# Patient Record
Sex: Female | Born: 1990
Health system: Southern US, Community
[De-identification: ages and names within clinical notes are randomized; demographics above are authoritative.]

## PROBLEM LIST (undated history)

## (undated) HISTORY — PX: TONSILLECTOMY AND ADENOIDECTOMY: SHX28

## (undated) HISTORY — PX: FINGER SURGERY: SHX640

---
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
## 2014-08-26 ENCOUNTER — Other Ambulatory Visit: Payer: Self-pay | Admitting: Obstetrics and Gynecology

## 2014-08-27 LAB — CYTOLOGY - PAP

## 2016-09-23 ENCOUNTER — Emergency Department (HOSPITAL_COMMUNITY): Payer: No Typology Code available for payment source

## 2016-09-23 ENCOUNTER — Emergency Department (HOSPITAL_COMMUNITY)
Admission: EM | Admit: 2016-09-23 | Discharge: 2016-09-23 | Disposition: A | Discharge status: Home / Self Care | Payer: No Typology Code available for payment source | Attending: Emergency Medicine | Admitting: Emergency Medicine

## 2016-09-23 ENCOUNTER — Encounter (HOSPITAL_COMMUNITY): Payer: Self-pay | Admitting: Emergency Medicine

## 2016-09-23 DIAGNOSIS — J029 Acute pharyngitis, unspecified: Secondary | ICD-10-CM

## 2016-09-23 DIAGNOSIS — Z79899 Other long term (current) drug therapy: Secondary | ICD-10-CM | POA: Diagnosis not present

## 2016-09-23 LAB — CBC WITH DIFFERENTIAL/PLATELET
Basophils Absolute: 0 10*3/uL (ref 0.0–0.1)
Basophils Relative: 0 %
Eosinophils Absolute: 0.1 10*3/uL (ref 0.0–0.7)
Eosinophils Relative: 0 %
HEMATOCRIT: 42 % (ref 36.0–46.0)
HEMOGLOBIN: 14.2 g/dL (ref 12.0–15.0)
LYMPHS ABS: 1.8 10*3/uL (ref 0.7–4.0)
LYMPHS PCT: 15 %
MCH: 30.8 pg (ref 26.0–34.0)
MCHC: 33.8 g/dL (ref 30.0–36.0)
MCV: 91.1 fL (ref 78.0–100.0)
MONOS PCT: 7 %
Monocytes Absolute: 0.8 10*3/uL (ref 0.1–1.0)
NEUTROS PCT: 78 %
Neutro Abs: 9.2 10*3/uL — ABNORMAL HIGH (ref 1.7–7.7)
Platelets: 247 10*3/uL (ref 150–400)
RBC: 4.61 MIL/uL (ref 3.87–5.11)
RDW: 12.5 % (ref 11.5–15.5)
WBC: 11.9 10*3/uL — AB (ref 4.0–10.5)

## 2016-09-23 LAB — TSH: TSH: 4.224 u[IU]/mL (ref 0.350–4.500)

## 2016-09-23 LAB — RAPID STREP SCREEN (MED CTR MEBANE ONLY): Streptococcus, Group A Screen (Direct): NEGATIVE

## 2016-09-23 LAB — I-STAT BETA HCG BLOOD, ED (MC, WL, AP ONLY): I-stat hCG, quantitative: <5 m[IU]/mL (ref <5)

## 2016-09-23 MED ORDER — PREDNISONE 20 MG PO TABS: 40.0000 mg | ORAL_TABLET | Freq: Every day | ORAL | 0 refills | Status: AC

## 2016-09-23 MED ORDER — DEXAMETHASONE SODIUM PHOSPHATE 10 MG/ML IJ SOLN
10.0000 mg | Freq: Once | INTRAMUSCULAR | Status: DC
  Filled 2016-09-23: qty 1

## 2016-09-23 MED ORDER — DEXAMETHASONE SODIUM PHOSPHATE 10 MG/ML IJ SOLN
10.0000 mg | Freq: Once | INTRAMUSCULAR | Status: AC
  Administered 2016-09-23: 10 mg via INTRAVENOUS

## 2016-09-23 MED ORDER — IOPAMIDOL (ISOVUE-300) INJECTION 61%
INTRAVENOUS | Status: AC
  Administered 2016-09-23: 75 mL
  Filled 2016-09-23: qty 75

## 2016-09-23 MED FILL — predniSONE 20 MG TABS: 20 | 5 days supply | Qty: 10 | Fill #0

## 2016-09-23 NOTE — Discharge Instructions (Signed)
Tylenol or Motrin for pain. Salt water gargles several times a day. Prednisone as prescribed until all gone. Over-the-counter allergy medications such as Zyrtec or Claritin daily. Follow-up with family doctor if not improving.

## 2016-09-23 NOTE — ED Notes (Signed)
EDPA Provider at bedside. 

## 2016-09-23 NOTE — ED Notes (Signed)
Will get vitals when pt. Return from xray. Nurse aware.

## 2016-09-23 NOTE — ED Provider Notes (Signed)
[facility]-EMERGENCY DEPT Provider Note   CSN: [id number] Arrival date & time: 09/23/16  [id number]     History   Chief Complaint Chief Complaint  Patient presents with  . Sore Throat  . Shortness of Breath    HPI Emily Quinn is a 26 y.o. female.  HPI Emily Quinn is a 26 y.o. female presents to emergency department complaining of sore throat. Patient states she initially developed some throat discomfort 2 weeks ago. She thought this could be allergies. In the last 2 days her pain has gotten worse. She woke up this morning with significant shortness of breath and feeling like her throat was closing up. She states it is difficult to breathing when laying down flat and she feels like her airway is closing. She is also reporting change in her voice, reports hoarseness, difficulty with speaking. She states that she is able to swallow but is painful. She is also having pain with opening her mouth. She has history of tonsillectomy. She has pain to the anterior neck with palpation. Denies pain with neck movement. Denies any fever or chills. No cough or congestion. She has been taking over-the-counter cold and sinus medication with no relief.  History reviewed. No pertinent past medical history.  There are no active problems to display for this patient.   Past Surgical History:  Procedure Laterality Date  . FINGER SURGERY    . TONSILLECTOMY AND ADENOIDECTOMY      OB History    No data available       Home Medications    Prior to Admission medications   Medication Sig Start Date End Date Taking? Authorizing Provider  Norethindrone-Ethinyl Estradiol-Fe Biphas (LO LOESTRIN FE) 1 MG-10 MCG / 10 MCG tablet Take 1 tablet by mouth daily.   Yes [provider]  Pseudoephed-APAP-Guaifenesin (TYLENOL SINUS SEVERE CONGEST) 30-325-200 MG TABS Take 2 tablets by mouth 2 (two) times daily as needed (for cold).   Yes [provider]    Family History Family History  Problem  Relation Age of Onset  . Hypertension Other   . Heart attack Other     Social History Social History  Substance Use Topics  . Smoking status: Never Smoker  . Smokeless tobacco: Never Used  . Alcohol use Yes     Comment: social     Allergies   Lobster [shellfish allergy]   Review of Systems Review of Systems  Constitutional: Negative for chills and fever.  HENT: Positive for postnasal drip, sore throat, trouble swallowing and voice change. Negative for congestion.   Respiratory: Negative for cough, chest tightness and shortness of breath.   Cardiovascular: Negative for chest pain, palpitations and leg swelling.  Gastrointestinal: Negative for abdominal pain, diarrhea, nausea and vomiting.  Genitourinary: Negative for dysuria, flank pain and pelvic pain.  Musculoskeletal: Negative for arthralgias, myalgias, neck pain and neck stiffness.  Skin: Negative for rash.  Neurological: Negative for dizziness, weakness and headaches.  All other systems reviewed and are negative.    Physical Exam Updated Vital Signs BP 130/89 (BP Location: Right Arm)   Pulse 93   Temp 98.3 F (36.8 C) (Oral)   Resp 18   Ht 5\' 3"  (1.6 m)   Wt 65.8 kg (145 lb)   LMP 08/28/2016 (Exact Date)   SpO2 99%   BMI 25.69 kg/m   Physical Exam  Constitutional: She appears well-developed and well-nourished. No distress.  HENT:  Head: Normocephalic.  Oropharynx is mildly erythematous, tonsils absent. Uvula midline. No  trismus. Patient is talking with her mouth closed with slightly muffled and hoarse voice. No lymphadenopathy.  Eyes: Conjunctivae are normal.  Neck: Neck supple.  Tenderness to palpation over anterior neck, just below the cricoid cartilage. Unable to palpate thyroid, no nodules or swelling noted.  Cardiovascular: Normal rate, regular rhythm and normal heart sounds.   Pulmonary/Chest: Effort normal and breath sounds normal. No respiratory distress. She has no wheezes. She has no rales.    Abdominal: Soft. Bowel sounds are normal. She exhibits no distension. There is no tenderness. There is no rebound.  Musculoskeletal: She exhibits no edema.  Neurological: She is alert.  Skin: Skin is warm and dry.  Psychiatric: She has a normal mood and affect. Her behavior is normal.  Nursing note and vitals reviewed.    ED Treatments / Results  Labs (all labs ordered are listed, but only abnormal results are displayed) Labs Reviewed  CBC WITH DIFFERENTIAL/PLATELET - Abnormal; Notable for the following:       Result Value   WBC 11.9 (*)    Neutro Abs 9.2 (*)    All other components within normal limits  RAPID STREP SCREEN (NOT AT [facility])  CULTURE, GROUP A STREP ([facility])  TSH  I-STAT BETA HCG BLOOD, ED ([facility], [facility], [facility] ONLY)    EKG  EKG Interpretation None       Radiology Ct Soft Tissue Neck W Contrast  Result Date: 09/23/2016 CLINICAL DATA:  Mid anterior neck pain with difficulty breathing and swallowing. Cold symptoms for 2 weeks. EXAM: CT NECK WITH CONTRAST TECHNIQUE: Multidetector CT imaging of the neck was performed using the standard protocol following the bolus administration of intravenous contrast. CONTRAST:  75mL ISOVUE-300 IOPAMIDOL (ISOVUE-300) INJECTION 61% COMPARISON:  None. FINDINGS: Pharynx and larynx: No pharyngeal mass or swelling. No parapharyngeal or retropharyngeal fluid collection or inflammatory change. Unremarkable larynx. Widely patent airway. Mild symmetric prominence of the lingual tonsils. Salivary glands: No inflammation, mass, or stone. Thyroid: Unremarkable. Lymph nodes: Minimally prominent level IIA lymph nodes measure up to 10 mm in short axis bilaterally. There is a slightly increased number of subcentimeter level IIB lymph nodes bilaterally. Vascular: Unremarkable. Limited intracranial: Unremarkable. Visualized orbits: Unremarkable. Mastoids and visualized paranasal sinuses: Clear. Skeleton: No acute abnormality or suspicious osseous lesion. Upper chest:  Unremarkable. Other: The skin marker indicating the area of anterior neck pain is located at the level of the thyroid cartilage. No underlying mass, fluid collection, or inflammatory change is identified. IMPRESSION: 1. No cause of neck pain or breathing/swallowing difficulty identified. 2. Mildly prominent bilateral level II lymph nodes, likely reactive. Electronically Signed   By: Sebastian Ache M.D.   On: 09/23/2016 10:28    Procedures Procedures (including critical care time)  Medications Ordered in ED Medications  dexamethasone (DECADRON) injection 10 mg (10 mg Intravenous Given 09/23/16 0819)  iopamidol (ISOVUE-300) 61 % injection (75 mLs  Contrast Given 09/23/16 0957)     Initial Impression / Assessment and Plan / ED Course  I have reviewed the triage vital signs and the nursing notes.  Pertinent labs & imaging results that were available during my care of the patient were reviewed by me and considered in my medical decision making (see chart for details).     Patient with severe sharp right. Afebrile, nontoxic appearing. Her voice is slightly muffled, she reports some shortness of breath laying down flat. Question airway edema versus abscess versus thyroiditis. We'll check CBC, TSH, strep, will get CT of neck.  10:47 AM CT  is negative. Blood work unremarkable other than slightly elevated white blood cell count of 11.9. TSH is within normal limits. Strep screen negative. Patient feels better after receiving Decadron IV. Discussed results with patient and her mother. Plan to discharge home with steroids. Start on allergy medication daily such as Claritin or Zyrtec. Tylenol or Motrin for pain, salt water gargles, Follow-up with family doctor as needed.  Vitals:   09/23/16 0545  BP: 130/89  Pulse: 93  Resp: 18  Temp: 98.3 F (36.8 C)  TempSrc: Oral  SpO2: 99%  Weight: 65.8 kg (145 lb)  Height: 5\' 3"  (1.6 m)     Final Clinical Impressions(s) / ED Diagnoses   Final diagnoses:    Pharyngitis, unspecified etiology    New Prescriptions New Prescriptions   PREDNISONE (DELTASONE) 20 MG TABLET    Take 2 tablets (40 mg total) by mouth daily.     Jaynie Crumble, PA-C 09/23/16 1048    Tilden Fossa, MD 09/24/16 [phone]

## 2016-09-23 NOTE — ED Notes (Signed)
Patient transported to CT 

## 2016-09-23 NOTE — ED Triage Notes (Signed)
Pt states she has been sick with cold like sxs for about 2 weeks but thought it was allergies  Pt states today her throat and neck are sore and it is hard to swallow  This morning she states she feels short of breath

## 2016-09-25 LAB — CULTURE, GROUP A STREP (THRC)

## 2016-11-30 MED FILL — LARIN FE 1-20 TABLET: 1-20 | 84 days supply | Qty: 84 | Fill #0

## 2017-02-23 MED FILL — LARIN FE 1-20 TABLET: 1-20 | 84 days supply | Qty: 84 | Fill #1

## 2017-06-01 MED FILL — LARIN FE 1-20 TABLET: 1-20 | 84 days supply | Qty: 84 | Fill #2

## 2017-09-02 MED FILL — NORETHIN-ESTRAD-FERR 1-0.02: 1-20 | 84 days supply | Qty: 84 | Fill #3

## 2017-11-23 MED FILL — NORETHIN-ESTRAD-FERR 1-0.02: 1-20 | 84 days supply | Qty: 84 | Fill #0

## 2018-05-31 IMAGING — CT CT NECK W/ CM
4 of 5 series · 14 of 33 positions shown, 16 images · IV contrast (iopamidol)
Comparison: None.

CLINICAL DATA: Mid anterior neck pain with difficulty breathing and
swallowing. Cold symptoms for 2 weeks.

EXAM:
CT NECK WITH CONTRAST
TECHNIQUE: Multidetector CT imaging of the neck was performed using the
standard protocol following the bolus administration of intravenous
contrast.
CONTRAST:  75mL XTPWCR-800 IOPAMIDOL (XTPWCR-800) INJECTION 61%

[Series 2: neck with st · axial · 0.42mm/px · z∈[-310,-178]mm · 4 of 112 slices shown, 5 images]
[im 23/112  soft-tissue]
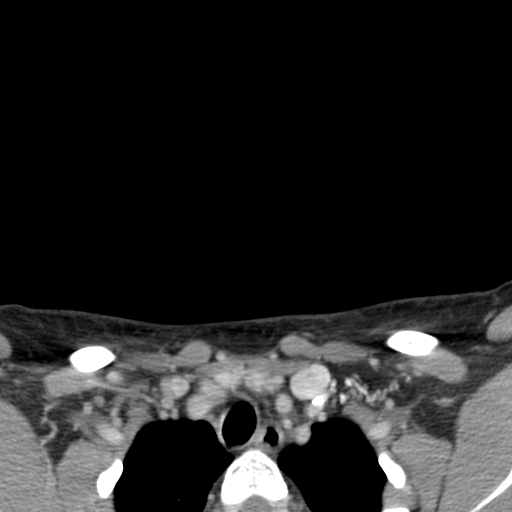
[im 23/112  bone]
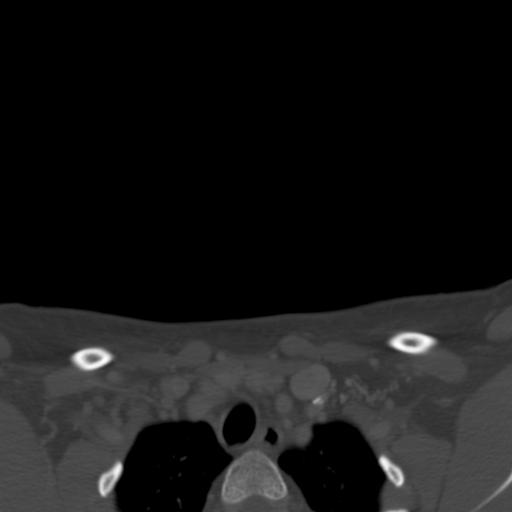
[im 45/112  bone]
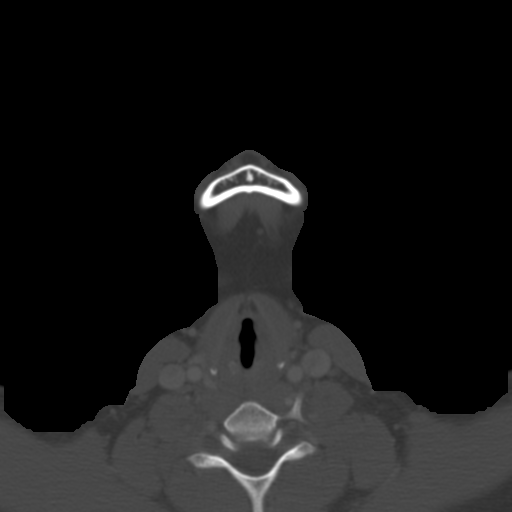
[im 67/112  bone]
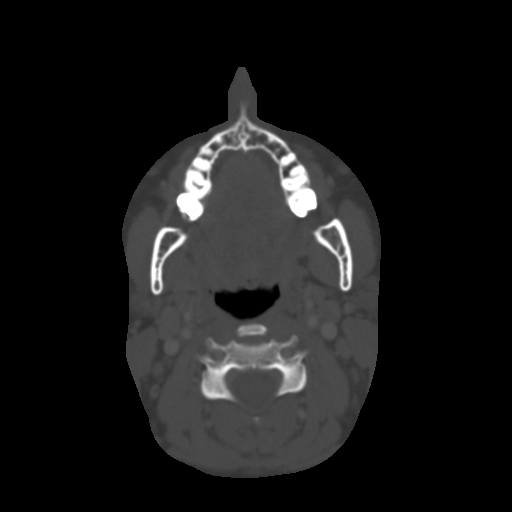
[im 89/112  bone]
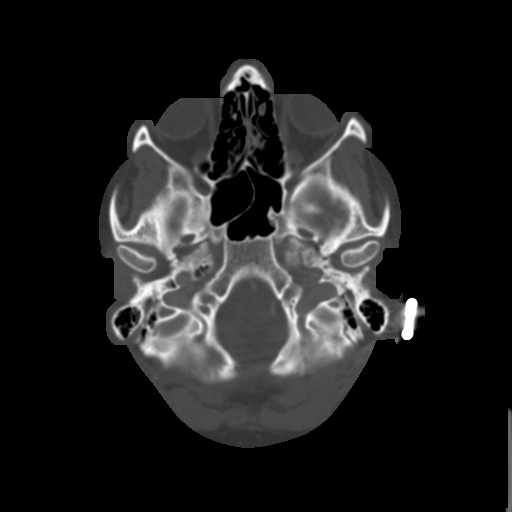

[Series 6: coronal st · coronal · 0.28mm/px · 3 of 107 slices shown]
[im 22/107  bone]
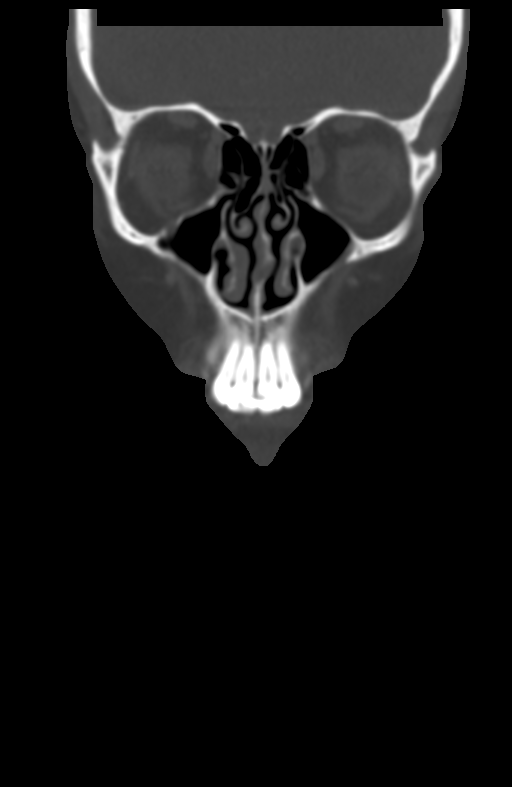
[im 43/107  bone]
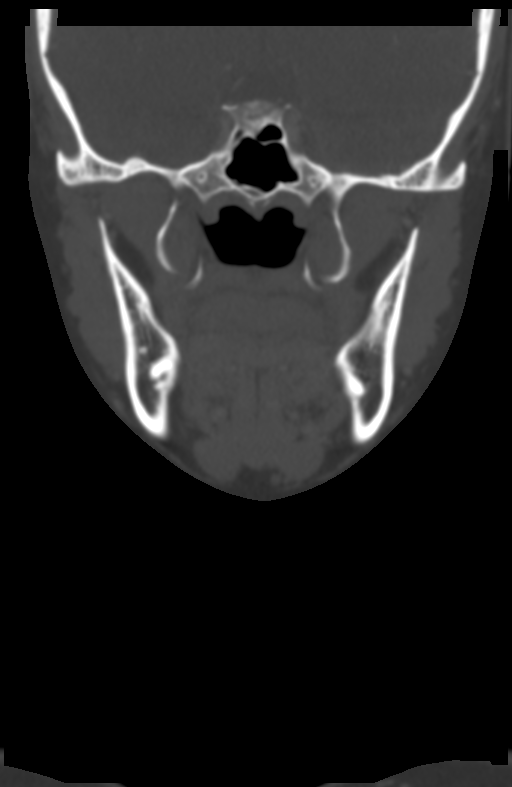
[im 64/107  bone]
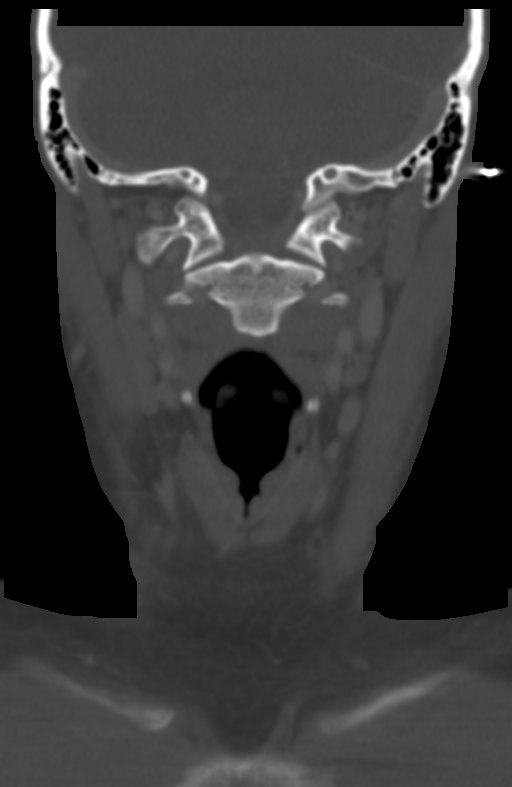

[Series 7: sagittal st · sagittal · 0.44mm/px · 5 of 82 slices shown, 6 images]
[im 28/82  bone]
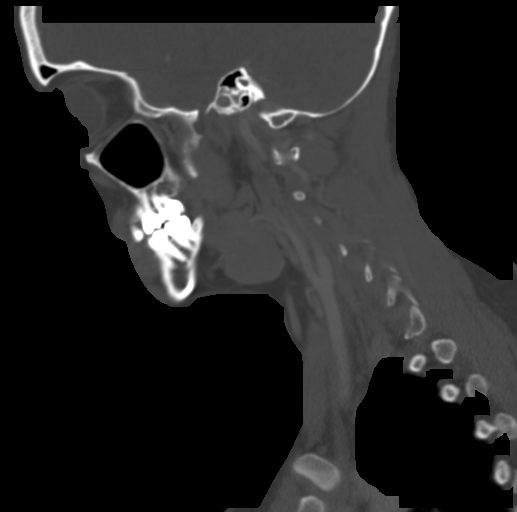
[im 34/82  bone]
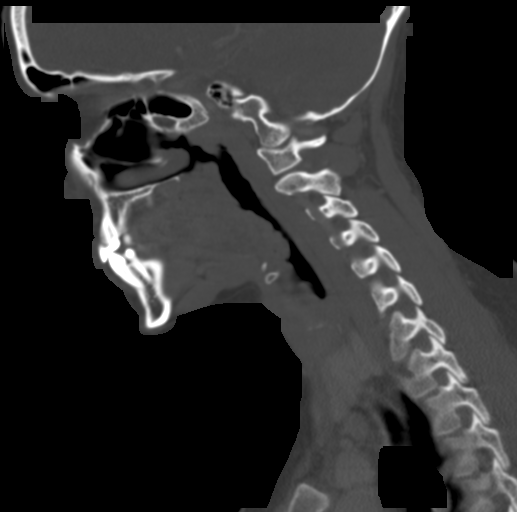
[im 41/82  soft-tissue]
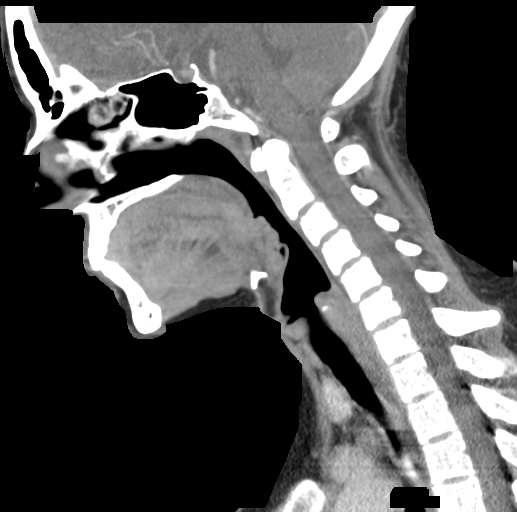
[im 41/82  bone]
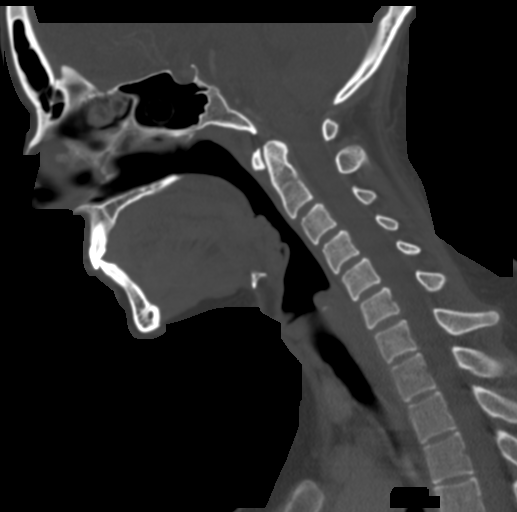
[im 48/82  bone]
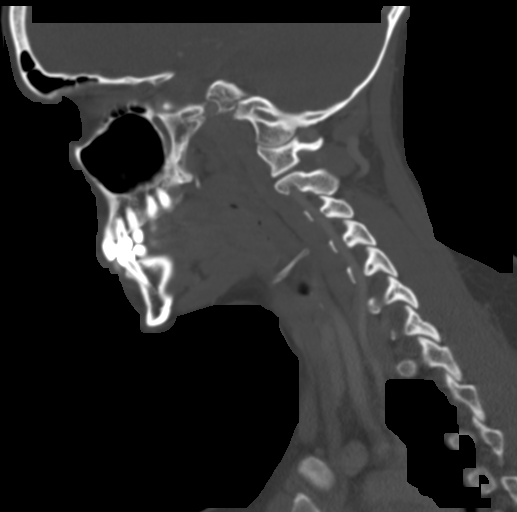
[im 55/82  bone]
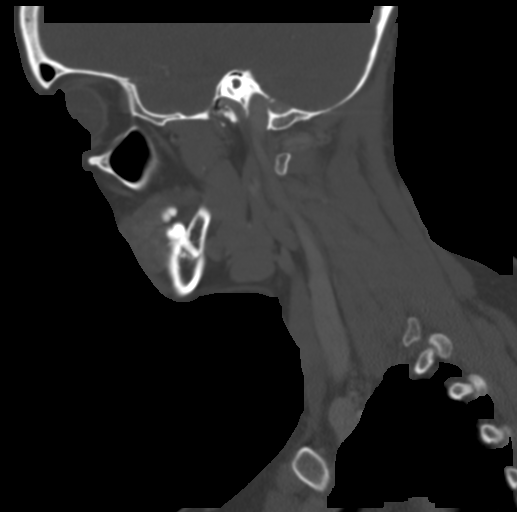

[Series 8: ax axial recons · axial · 0.28mm/px · z∈[-307,-263]mm · 2 of 110 slices shown]
[im 22/110  bone]
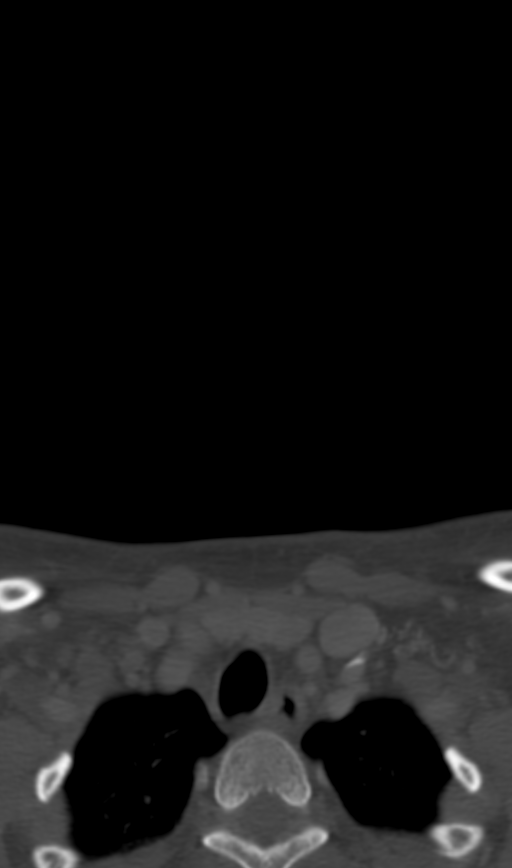
[im 44/110  bone]
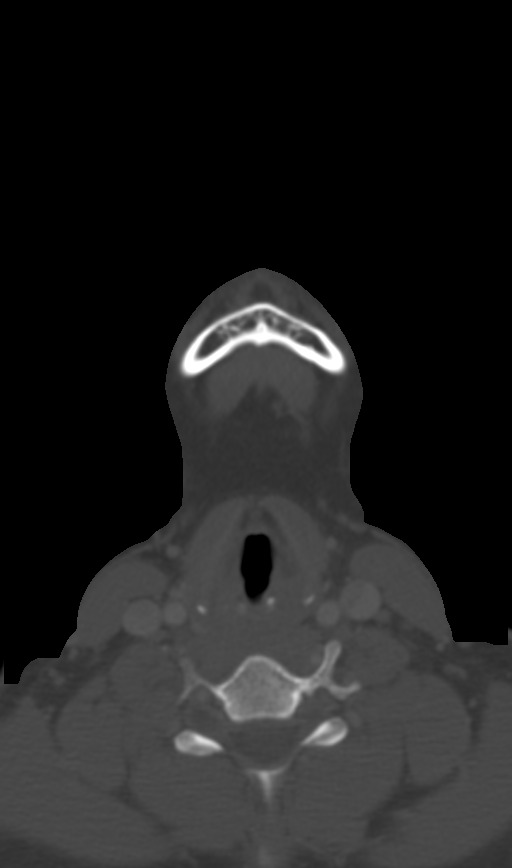

[14 of 33 positions shown; findings below may reference images not displayed]

FINDINGS: Pharynx and larynx: No pharyngeal mass or swelling. No
parapharyngeal or retropharyngeal fluid collection or inflammatory
change. Unremarkable larynx. Widely patent airway. Mild symmetric
prominence of the lingual tonsils.

Salivary glands: No inflammation, mass, or stone.

Thyroid: Unremarkable.

Lymph nodes: Minimally prominent level IIA lymph nodes measure up to
10 mm in short axis bilaterally. There is a slightly increased
number of subcentimeter level IIB lymph nodes bilaterally.

Vascular: Unremarkable.

Limited intracranial: Unremarkable.

Visualized orbits: Unremarkable.

Mastoids and visualized paranasal sinuses: Clear.

Skeleton: No acute abnormality or suspicious osseous lesion.

Upper chest: Unremarkable.

Other: The skin marker indicating the area of anterior neck pain is
located at the level of the thyroid cartilage. No underlying mass,
fluid collection, or inflammatory change is identified.
IMPRESSION: 1. No cause of neck pain or breathing/swallowing difficulty
identified.
2. Mildly prominent bilateral level II lymph nodes, likely reactive.
